# Patient Record
Sex: Male | Born: 1995 | Race: White | Hispanic: No | Marital: Single | Smoking: Current every day smoker
Health system: Southern US, Community
[De-identification: ages and names within clinical notes are randomized; demographics above are authoritative.]

## PROBLEM LIST (undated history)

## (undated) DIAGNOSIS — M9111 Juvenile osteochondrosis of head of femur [Legg-Calve-Perthes], right leg: Secondary | ICD-10-CM

## (undated) HISTORY — PX: OTHER SURGICAL HISTORY: SHX169

---

## 2016-09-09 ENCOUNTER — Emergency Department (HOSPITAL_BASED_OUTPATIENT_CLINIC_OR_DEPARTMENT_OTHER): Payer: Medicaid Other

## 2016-09-09 ENCOUNTER — Encounter (HOSPITAL_BASED_OUTPATIENT_CLINIC_OR_DEPARTMENT_OTHER): Payer: Self-pay | Admitting: *Deleted

## 2016-09-09 ENCOUNTER — Emergency Department (HOSPITAL_BASED_OUTPATIENT_CLINIC_OR_DEPARTMENT_OTHER)
Admission: EM | Admit: 2016-09-09 | Discharge: 2016-09-09 | Disposition: A | Discharge status: Home / Self Care | Payer: Medicaid Other | Attending: Emergency Medicine | Admitting: Emergency Medicine

## 2016-09-09 DIAGNOSIS — F17210 Nicotine dependence, cigarettes, uncomplicated: Secondary | ICD-10-CM | POA: Diagnosis not present

## 2016-09-09 DIAGNOSIS — Y999 Unspecified external cause status: Secondary | ICD-10-CM | POA: Insufficient documentation

## 2016-09-09 DIAGNOSIS — X501XXA Overexertion from prolonged static or awkward postures, initial encounter: Secondary | ICD-10-CM | POA: Insufficient documentation

## 2016-09-09 DIAGNOSIS — Y929 Unspecified place or not applicable: Secondary | ICD-10-CM | POA: Insufficient documentation

## 2016-09-09 DIAGNOSIS — M25511 Pain in right shoulder: Secondary | ICD-10-CM | POA: Diagnosis not present

## 2016-09-09 DIAGNOSIS — Y9389 Activity, other specified: Secondary | ICD-10-CM | POA: Insufficient documentation

## 2016-09-09 DIAGNOSIS — S4991XA Unspecified injury of right shoulder and upper arm, initial encounter: Secondary | ICD-10-CM | POA: Diagnosis present

## 2016-09-09 HISTORY — DX: Juvenile osteochondrosis of head of femur (legg-calve-perthes), right leg: M91.11

## 2016-09-09 MED ORDER — ACETAMINOPHEN 500 MG PO TABS
1000.0000 mg | ORAL_TABLET | Freq: Once | ORAL | Status: AC
  Administered 2016-09-09: 1000 mg via ORAL
  Filled 2016-09-09: qty 2

## 2016-09-09 MED ORDER — IBUPROFEN 400 MG PO TABS
600.0000 mg | ORAL_TABLET | Freq: Once | ORAL | Status: AC
  Administered 2016-09-09: 19:00:00 600 mg via ORAL
  Filled 2016-09-09: qty 1

## 2016-09-09 MED ORDER — METHOCARBAMOL 500 MG PO TABS: 500.0000 mg | ORAL_TABLET | Freq: Four times a day (QID) | ORAL | 0 refills | Status: AC | PRN

## 2016-09-09 NOTE — ED Provider Notes (Signed)
[facility] Provider Note   CSN: [id number] Arrival date & time: 09/09/16  1758   By signing my name below, I, Clarisse GougeXavier Herndon, attest that this documentation has been prepared under the direction and in the presence of Sharen Hecklaudia Gibbons, PA-C. Electronically Signed: Clarisse GougeXavier Herndon, Scribe. 09/09/16. 7:04 PM.   History   Chief Complaint Chief Complaint  Patient presents with  . Shoulder Injury   The history is provided by the patient and medical records. No language interpreter was used.    HPI Comments: Jordan Nguyen is a 21 y.o. male who presents to the Emergency Department complaining of sudden onset right shoulder pain today. Pt reports associated intermittent tingling to the extremity. He states he was pushing a bed when he heard a pop and felt a buckle in his shoulder. He describes the pain as throbbing, > to the lateral region of the shoulder, exacerbated with lifting his RUE. Pt denies Hx of previous shoulder surgery, dislocation or injury.  Past Medical History:  Diagnosis Date  . Perthes disease, right   . Perthes disease, right   . Perthes disease, right     There are no active problems to display for this patient.   Past Surgical History:  Procedure Laterality Date  . perthes disease-extensive surgery to right leg         Home Medications    Prior to Admission medications   Medication Sig Start Date End Date Taking? Authorizing Provider  methocarbamol (ROBAXIN) 500 MG tablet Take 1 tablet (500 mg total) by mouth every 6 (six) hours as needed for muscle spasms. 09/09/16 09/16/16  Liberty Handylaudia J Gibbons, PA-C    Family History History reviewed. No pertinent family history.  Social History Social History  Substance Use Topics  . Smoking status: Current Every Day Smoker    Types: E-cigarettes  . Smokeless tobacco: Never Used  . Alcohol use No     Allergies   Amoxicillin and Sulfur   Review of Systems Review of Systems  Constitutional:  Negative for chills, diaphoresis and fever.  Musculoskeletal: Positive for joint swelling and myalgias. Negative for arthralgias, back pain, neck pain and neck stiffness.  Skin: Negative for wound.  Allergic/Immunologic: Negative for immunocompromised state.  Neurological: Negative for weakness and numbness.       +tingling  Hematological: Does not bruise/bleed easily.  Psychiatric/Behavioral: Negative for confusion.     Physical Exam Updated Vital Signs BP 148/84 (BP Location: Left Arm)   Pulse 66   Temp 98.1 F (36.7 C) (Oral)   Resp 18   Ht 5\' 11"  (1.803 m)   Wt 145 lb (65.8 kg)   SpO2 99%   BMI 20.22 kg/m   Physical Exam  Constitutional: He is oriented to person, place, and time. He appears well-developed and well-nourished. No distress.  HENT:  Head: Normocephalic and atraumatic.  Right Ear: External ear normal.  Left Ear: External ear normal.  Eyes: Conjunctivae and EOM are normal. Pupils are equal, round, and reactive to light. No scleral icterus.  Neck: Normal range of motion. Neck supple.  No midline cervical spine tenderness over spinous processes. Full active ROM of cervical spine without pain.   Cardiovascular: Normal rate, regular rhythm, normal heart sounds and intact distal pulses.   No murmur heard. Pulmonary/Chest: Effort normal and breath sounds normal. He has no wheezes.  Musculoskeletal: Normal range of motion. He exhibits tenderness. He exhibits no edema or deformity.  Full active ROM of right shoulder, reported pain with shoulder abduction  and flexion.  Tenderness over right AC joint and biceps tendon groove.  +hawkin's, +neer's, +empty can, + speed's test - ROOST, - sulcus sign, - lift off   Lymphadenopathy:    He has no cervical adenopathy.  Neurological: He is alert and oriented to person, place, and time. He displays normal reflexes. No sensory deficit. He exhibits normal muscle tone. Coordination normal.  5/5 strength with shoulder raise,  abduction and adduction, bilaterally.  5/5 strength with elbow flexion and extension, bilaterally.  5/5 strength with wrist flexion and extension.  5/5 strength with finger abduction 2/4 biceps, triceps and BR DTR bilaterally. Good pincer and hand grip bilaterally.  Sensation to light touch intact in median, ulnar and radial nerve distribution, bilaterally.   Skin: Skin is warm and dry. Capillary refill takes less than 2 seconds.  Psychiatric: He has a normal mood and affect. His behavior is normal. Judgment and thought content normal.  Nursing note and vitals reviewed.    ED Treatments / Results  DIAGNOSTIC STUDIES: Oxygen Saturation is 99% on RA, normal by my interpretation.    COORDINATION OF CARE: 7:00 PM Discussed treatment plan with pt at bedside and pt agreed to plan. Pt advised of symptomatic care at home and to return for worsening symptoms. Will Rx medications. Will refer to orthopedic specialist.  Labs (all labs ordered are listed, but only abnormal results are displayed) Labs Reviewed - No data to display  EKG  EKG Interpretation None       Radiology Dg Shoulder Right  Result Date: 09/09/2016 CLINICAL DATA:  21 year old male with acute right shoulder pain following injury today. Initial encounter. EXAM: RIGHT SHOULDER - 2+ VIEW COMPARISON:  None. FINDINGS: There is no evidence of fracture or dislocation. There is no evidence of arthropathy or other focal bone abnormality. Soft tissues are unremarkable. IMPRESSION: Negative. Electronically Signed   By: Harmon Pier M.D.   On: 09/09/2016 18:35    Procedures Procedures (including critical care time)  Medications Ordered in ED Medications  ibuprofen (ADVIL,MOTRIN) tablet 600 mg (600 mg Oral Given 09/09/16 1926)  acetaminophen (TYLENOL) tablet 1,000 mg (1,000 mg Oral Given 09/09/16 1928)     Initial Impression / Assessment and Plan / ED Course  I have reviewed the triage vital signs and the nursing  notes.  Pertinent labs & imaging results that were available during my care of the patient were reviewed by me and considered in my medical decision making (see chart for details).  Clinical Course as of Sep 10 1951  Wynelle Link Sep 09, 2016  1610 FINDINGS: There is no evidence of fracture or dislocation. There is no evidence of arthropathy or other focal bone abnormality. Soft tissues are unremarkable.  IMPRESSION: Negative. DG Shoulder Right [CG]    Clinical Course User Index [CG] Liberty Handy, PA-C   21 year old presents with acute onset right shoulder pain. Patient was pushing a bed when he felt a pop in his right shoulder, there is tenderness over right AC joint,  biceps tendon groove and lateral shoulder. Patient had a full active range of motion of the right shoulder, there was pain with active abduction and flexion. +hawkin's, +neer's, +empty can, + speed's test.  Extremity is neurovascularly intact. X-rays negative for fracture or dislocation, soft tissues are unremarkable. Given reassuring physical exam findings and negative x-ray, suspect musculoskeletal/soft tissue injury. Patient does not have history of shoulder dislocations or surgeries. Will discharge with NSAIDs, muscle relaxer, sling for comfort, rest and ice. Cannot rule out  SLAP injury or rotator cuff injury, advised patient and patient's mother at bedside to follow up with orthopedist for further outpatient imaging and treatment. Patient verbalized understanding, is agreeable to discharge.  I personally performed the services described in this documentation, which was scribed in my presence. The recorded information has been reviewed and is accurate.   Final Clinical Impressions(s) / ED Diagnoses   Final diagnoses:  Acute pain of right shoulder    New Prescriptions Discharge Medication List as of 09/09/2016  7:39 PM    START taking these medications   Details  methocarbamol (ROBAXIN) 500 MG tablet Take 1 tablet (500  mg total) by mouth every 6 (six) hours as needed for muscle spasms., Starting Sun 09/09/2016, Until Sun 09/16/2016, Print         Liberty Handy, PA-C 09/09/16 1953    Rolan Bucco, MD 09/09/16 [phone]

## 2016-09-09 NOTE — ED Notes (Signed)
Patient transported to X-ray 

## 2016-09-09 NOTE — ED Notes (Signed)
Pt and mother given d/c instructions as per chart. Rx x 1. Verbalizes understanding. No questions. 

## 2016-09-09 NOTE — Discharge Instructions (Signed)
Your x-ray was negative for dislocation and fracture.  Your pain may be due to a soft tissue or musculoskeletal injury.  We will treat your symptoms with anti-inflammatory medications (600 mg ibuprofen + 1000 mg tylenol three times a day x 3-5 days) and muscle relaxer (robaxin 500 mg four times a day x 3-5 days).  Rest your shoulder and ice for the next 3 days.  After three days of rest you may start to do light shoulder exercises to prevent further weakness and tightness.   Even though your x-rays were negative today we have not ruled out soft tissue injury.  Please call orthopedist and make an appointment for outpatient imaging and further treatment.   Return to the emergency department if you have numbness, tingling or weakness.

## 2016-09-09 NOTE — ED Notes (Signed)
Pt states he was moving a bed and felt his right shoulder "pop". Moves fingers. Feels touch. Cap refill < 3 sec. Ice applied.

## 2016-09-09 NOTE — ED Triage Notes (Signed)
Pt reports that he was pushing a bed and felt a pop in his right shoulder.  No obvious deformity noted.  ROM intact-with mild pain.  Pt is tender on palpation of posterior shoulder.

## 2018-03-07 IMAGING — CR DG SHOULDER 2+V*R*
3 series · 3 of 3 positions shown · non-contrast
Comparison: None.

CLINICAL DATA: 20-year-old male with acute right shoulder pain
following injury today. Initial encounter.

EXAM:
RIGHT SHOULDER - 2+ VIEW

[w shoulder grashey right]
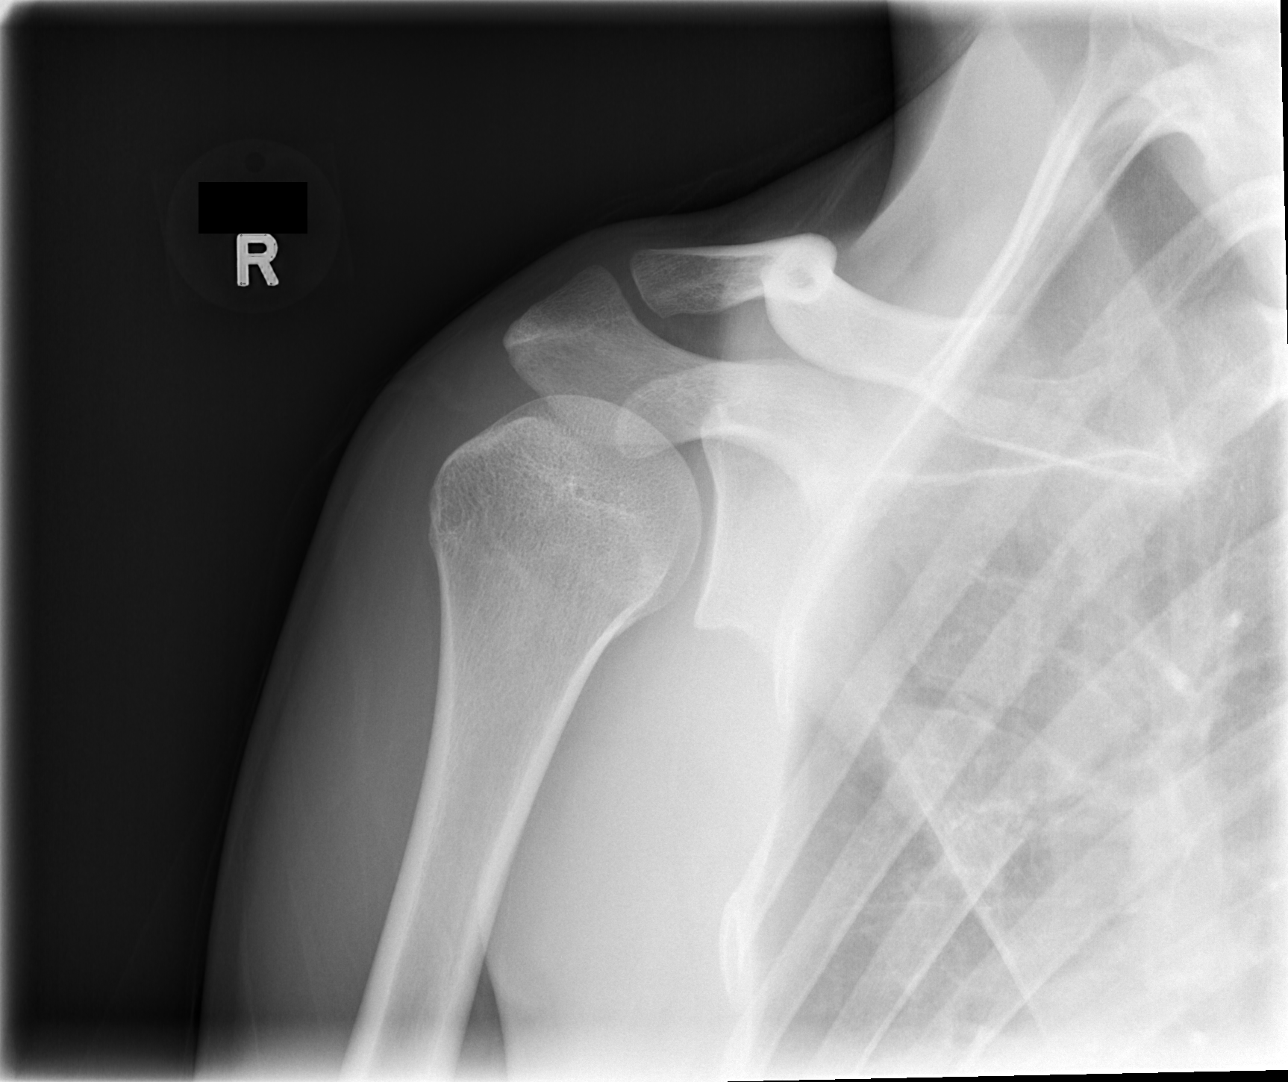

[w shoulder y view right]
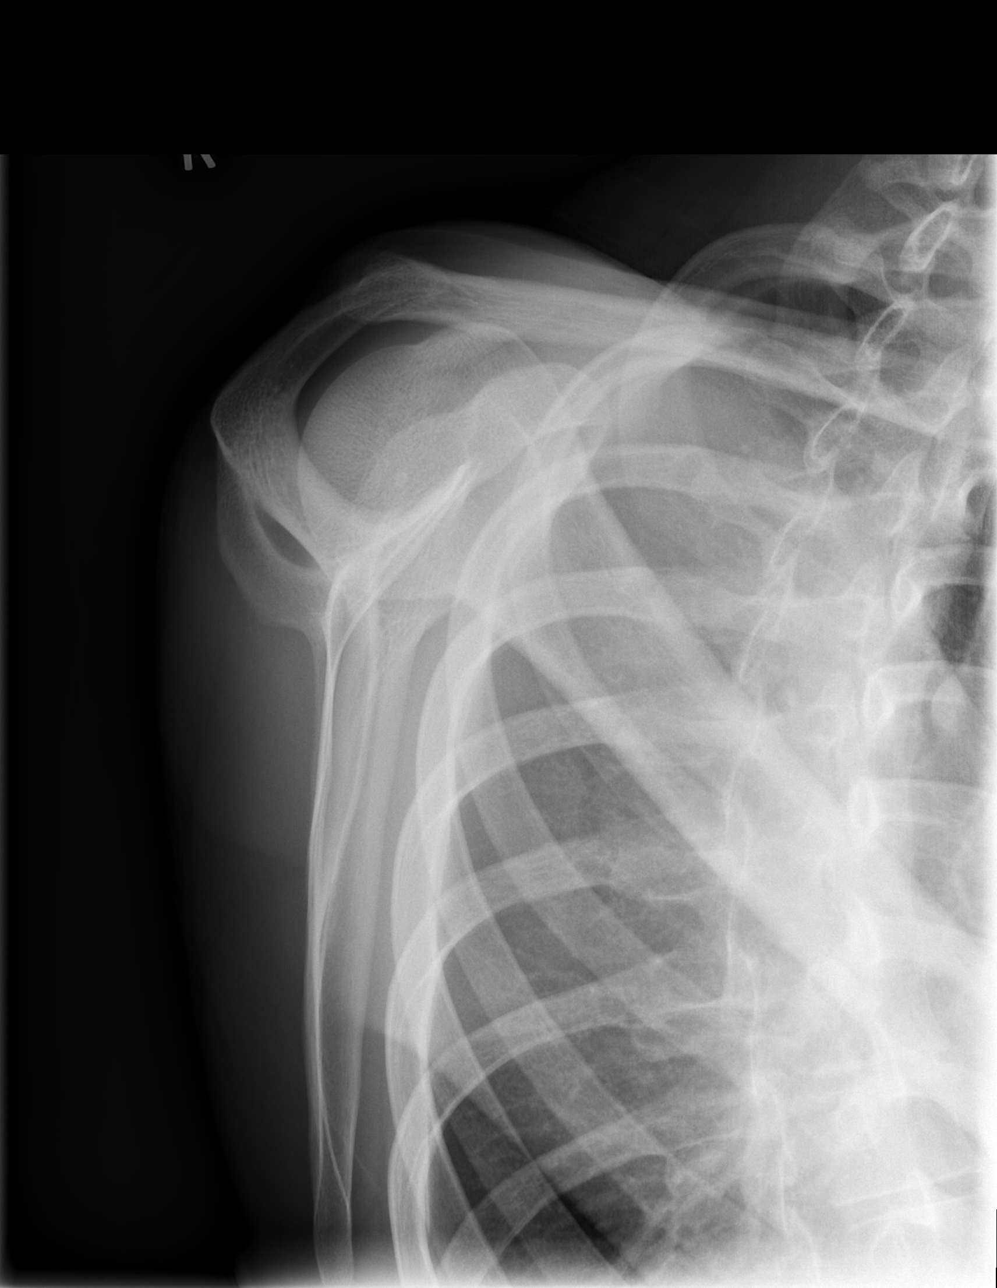

[x shoulder axillary right]
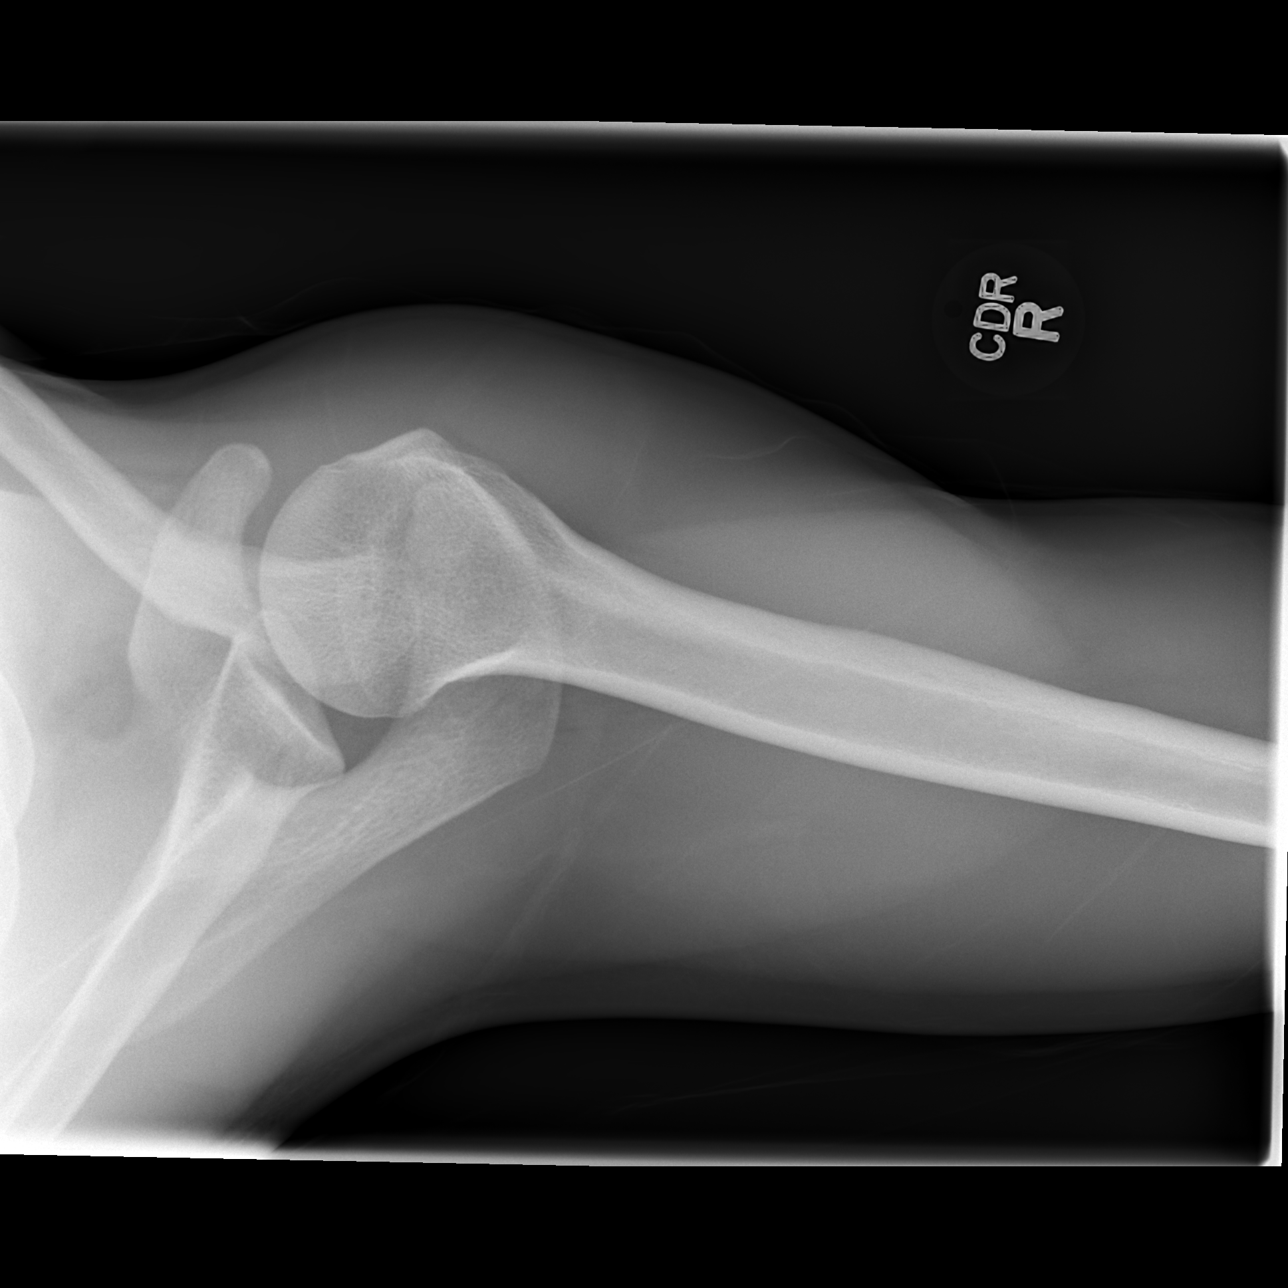

[3 of 3 positions shown; findings below may reference images not displayed]

FINDINGS: There is no evidence of fracture or dislocation. There is no
evidence of arthropathy or other focal bone abnormality. Soft
tissues are unremarkable.
IMPRESSION: Negative.
